# Patient Record
Sex: Male | Born: 1995 | Race: White | Hispanic: No | Marital: Single | Smoking: Never smoker
Health system: Southern US, Community
[De-identification: ages and names within clinical notes are randomized; demographics above are authoritative.]

## PROBLEM LIST (undated history)

## (undated) DIAGNOSIS — F909 Attention-deficit hyperactivity disorder, unspecified type: Secondary | ICD-10-CM

---

## 2018-06-22 ENCOUNTER — Encounter (HOSPITAL_COMMUNITY): Payer: Self-pay | Admitting: *Deleted

## 2018-06-22 ENCOUNTER — Other Ambulatory Visit: Payer: Self-pay

## 2018-06-22 ENCOUNTER — Emergency Department (HOSPITAL_COMMUNITY)
Admission: EM | Admit: 2018-06-22 | Discharge: 2018-06-22 | Discharge status: Against Medical Advice | Payer: Medicaid - Out of State | Attending: Emergency Medicine | Admitting: Emergency Medicine

## 2018-06-22 DIAGNOSIS — Z5321 Procedure and treatment not carried out due to patient leaving prior to being seen by health care provider: Secondary | ICD-10-CM | POA: Insufficient documentation

## 2018-06-22 DIAGNOSIS — R21 Rash and other nonspecific skin eruption: Secondary | ICD-10-CM | POA: Diagnosis present

## 2018-06-22 NOTE — ED Triage Notes (Signed)
Pt arrives with rash on bilateral legs. Started two days ago. Started at work, reports he had just had lunch outside prior.

## 2019-09-22 ENCOUNTER — Emergency Department (HOSPITAL_COMMUNITY)
Admission: EM | Admit: 2019-09-22 | Discharge: 2019-09-22 | Disposition: A | Discharge status: Home / Self Care | Payer: Medicaid - Out of State | Attending: Emergency Medicine | Admitting: Emergency Medicine

## 2019-09-22 ENCOUNTER — Other Ambulatory Visit: Payer: Self-pay

## 2019-09-22 ENCOUNTER — Emergency Department (HOSPITAL_COMMUNITY): Payer: Medicaid - Out of State

## 2019-09-22 ENCOUNTER — Encounter (HOSPITAL_COMMUNITY): Payer: Self-pay | Admitting: *Deleted

## 2019-09-22 DIAGNOSIS — S3022XA Contusion of scrotum and testes, initial encounter: Secondary | ICD-10-CM | POA: Insufficient documentation

## 2019-09-22 DIAGNOSIS — X58XXXA Exposure to other specified factors, initial encounter: Secondary | ICD-10-CM | POA: Insufficient documentation

## 2019-09-22 DIAGNOSIS — Y999 Unspecified external cause status: Secondary | ICD-10-CM | POA: Insufficient documentation

## 2019-09-22 DIAGNOSIS — Y939 Activity, unspecified: Secondary | ICD-10-CM | POA: Diagnosis not present

## 2019-09-22 DIAGNOSIS — Y929 Unspecified place or not applicable: Secondary | ICD-10-CM | POA: Insufficient documentation

## 2019-09-22 DIAGNOSIS — S3094XA Unspecified superficial injury of scrotum and testes, initial encounter: Secondary | ICD-10-CM | POA: Diagnosis present

## 2019-09-22 DIAGNOSIS — N50819 Testicular pain, unspecified: Secondary | ICD-10-CM

## 2019-09-22 HISTORY — DX: Attention-deficit hyperactivity disorder, unspecified type: F90.9

## 2019-09-22 LAB — URINALYSIS, ROUTINE W REFLEX MICROSCOPIC
Bilirubin Urine: NEGATIVE
Glucose, UA: NEGATIVE mg/dL
Hgb urine dipstick: NEGATIVE
Ketones, ur: NEGATIVE mg/dL
Leukocytes,Ua: NEGATIVE
Nitrite: NEGATIVE
Protein, ur: NEGATIVE mg/dL
Specific Gravity, Urine: 1.025 (ref 1.005–1.030)
pH: 5 (ref 5.0–8.0)

## 2019-09-22 NOTE — ED Provider Notes (Signed)
[facility] EMERGENCY DEPARTMENT Provider Note   CSN: [id number] Arrival date & time: 09/22/19  7001     History Chief Complaint  Patient presents with  . Testicle Pain    Andrew Hobbs is a 24 y.o. male who presents to ED with a chief complaint of right testicle pain.  States that he was wrestling about 48 hours ago when he was injured in the area.  He has had persistent aching pain since waking up the next morning.  He has tried icing it with only minimal improvement in his symptoms.  He reports pain is worse with palpation but denies any urinary symptoms, abdominal pain, vomiting, fever, penile discharge or concern for STDs.  He is concerned that he may have testicular torsion and would like to be checked for this.  HPI     Past Medical History:  Diagnosis Date  . ADHD     There are no problems to display for this patient.   History reviewed. No pertinent surgical history.     History reviewed. No pertinent family history.  Social History   Tobacco Use  . Smoking status: Never Smoker  . Smokeless tobacco: Never Used  Substance Use Topics  . Alcohol use: Not on file  . Drug use: Not on file    Home Medications Prior to Admission medications   Not on File    Allergies    Amoxicillin  Review of Systems   Review of Systems  Constitutional: Negative for appetite change, chills and fever.  HENT: Negative for ear pain, rhinorrhea, sneezing and sore throat.   Eyes: Negative for photophobia and visual disturbance.  Respiratory: Negative for cough, chest tightness, shortness of breath and wheezing.   Cardiovascular: Negative for chest pain and palpitations.  Gastrointestinal: Negative for abdominal pain, blood in stool, constipation, diarrhea, nausea and vomiting.  Genitourinary: Positive for testicular pain. Negative for discharge, dysuria, hematuria, penile swelling, scrotal swelling and urgency.  Musculoskeletal: Negative for myalgias.  Skin:  Negative for rash.  Neurological: Negative for dizziness, weakness and light-headedness.    Physical Exam Updated Vital Signs BP 110/78   Pulse (!) 54   Temp 98.1 F (36.7 C) (Oral)   Resp 16   SpO2 100%   Physical Exam Vitals and nursing note reviewed. Exam conducted with a chaperone present.  Constitutional:      General: He is not in acute distress.    Appearance: He is well-developed.  HENT:     Head: Normocephalic and atraumatic.     Nose: Nose normal.  Eyes:     General: No scleral icterus.       Left eye: No discharge.     Conjunctiva/sclera: Conjunctivae normal.  Cardiovascular:     Rate and Rhythm: Normal rate and regular rhythm.     Heart sounds: Normal heart sounds. No murmur heard.  No friction rub. No gallop.   Pulmonary:     Effort: Pulmonary effort is normal. No respiratory distress.     Breath sounds: Normal breath sounds.  Abdominal:     General: Bowel sounds are normal. There is no distension.     Palpations: Abdomen is soft.     Tenderness: There is no abdominal tenderness. There is no guarding.  Genitourinary:    Testes:        Right: Tenderness present. Mass or testicular hydrocele not present.       Comments: Tenderness in the indicated area without erythema, edema noted.  No diffuse tenderness.  RN served as Biomedical engineer for exam. Musculoskeletal:        General: Normal range of motion.     Cervical back: Normal range of motion and neck supple.  Skin:    General: Skin is warm and dry.     Findings: No rash.  Neurological:     Mental Status: He is alert.     Motor: No abnormal muscle tone.     Coordination: Coordination normal.     ED Results / Procedures / Treatments   Labs (all labs ordered are listed, but only abnormal results are displayed) Labs Reviewed  URINALYSIS, ROUTINE W REFLEX MICROSCOPIC    EKG None  Radiology US SCROTUM W/DOPPLER  Result Date: 09/22/2019 CLINICAL DATA:  Right-sided scrotal pain for 48 hours. EXAM:  SCROTAL ULTRASOUND DOPPLER ULTRASOUND OF THE TESTICLES TECHNIQUE: Complete ultrasound examination of the testicles, epididymis, and other scrotal structures was performed. Color and spectral Doppler ultrasound were also utilized to evaluate blood flow to the testicles. COMPARISON:  None. FINDINGS: Right testicle Measurements: 4.4 x 1.8 x 3.3 cm. No mass or microlithiasis visualized. Left testicle Measurements: 4.9 x 1.9 x 2.9 cm. No mass or microlithiasis visualized. Right epididymis:  Normal in size and appearance. Left epididymis:  Normal in size and appearance. Hydrocele:  Trace left hydrocele. Varicocele:  Small left varicocele. Pulsed Doppler interrogation of both testes demonstrates normal low resistance arterial and venous waveforms bilaterally. IMPRESSION: 1. No acute findings. No evidence of testicular torsion or of epididymitis/orchitis. No testicular masses. 2. Trace left hydrocele and small left varicocele. No other abnormalities. Electronically Signed   By: Amie Portland M.D.   On: 09/22/2019 13:00    Procedures Procedures (including critical care time)  Medications Ordered in ED Medications - No data to display  ED Course  I have reviewed the triage vital signs and the nursing notes.  Pertinent labs & imaging results that were available during my care of the patient were reviewed by me and considered in my medical decision making (see chart for details).    MDM Rules/Calculators/A&P                          24 year old male presenting to the ED for right testicle pain after wrestling injury 48 hours ago.  Pain worsened the next morning.  No urinary symptoms, abdominal pain, vomiting, fever, penile discharge or concern for STDs.  There is some tenderness of the right testicle noted on physical exam without erythema or edema.  No diffuse tenderness or abnormalities.  Urinalysis is unremarkable.  Scrotal ultrasound showing no acute findings, no evidence of torsion, epididymitis, orchitis  or masses. Suspect possible contusion.  Will treat with NSAIDs and PCP follow-up. Declines STD testing.   Patient is hemodynamically stable, in NAD, and able to ambulate in the ED. Evaluation does not show pathology that would require ongoing emergent intervention or inpatient treatment. I explained the diagnosis to the patient. Pain has been managed and has no complaints prior to discharge. Patient is comfortable with above plan and is stable for discharge at this time. All questions were answered prior to disposition. Strict return precautions for returning to the ED were discussed. Encouraged follow up with PCP.   An After Visit Summary was printed and given to the patient.   Portions of this note were generated with Scientist, clinical (histocompatibility and immunogenetics). Dictation errors may occur despite best attempts at proofreading.  Final Clinical Impression(s) / ED Diagnoses Final diagnoses:  Contusion  of testis, initial encounter    Rx / DC Orders ED Discharge Orders    None       Dietrich Pates, PA-C 09/22/19 1328    Arby Barrette, MD 09/25/19 [phone]

## 2019-09-22 NOTE — ED Triage Notes (Signed)
Pt reports having testicle pain x 2 days but reports wrestling and had injury 2 days ago, reports pain with palpation. No acute distress is noted at triage.

## 2019-09-22 NOTE — Discharge Instructions (Signed)
Follow instructions regarding your pain. Follow-up with your primary care provider or the 1 listed below. Return to the ER for worsening pain, additional injuries, increased swelling, redness or fever.

## 2022-01-25 IMAGING — US US SCROTUM W/ DOPPLER COMPLETE
1 series · 14 of 25 positions shown · non-contrast
Comparison: None.

CLINICAL DATA: Right-sided scrotal pain for 48 hours.

EXAM:
SCROTAL ULTRASOUND
DOPPLER ULTRASOUND OF THE TESTICLES
TECHNIQUE: Complete ultrasound examination of the testicles, epididymis, and
other scrotal structures was performed. Color and spectral Doppler
ultrasound were also utilized to evaluate blood flow to the
testicles.

[Series 1: us scrotum · 14 of 74 slices shown]
[im 1/74]
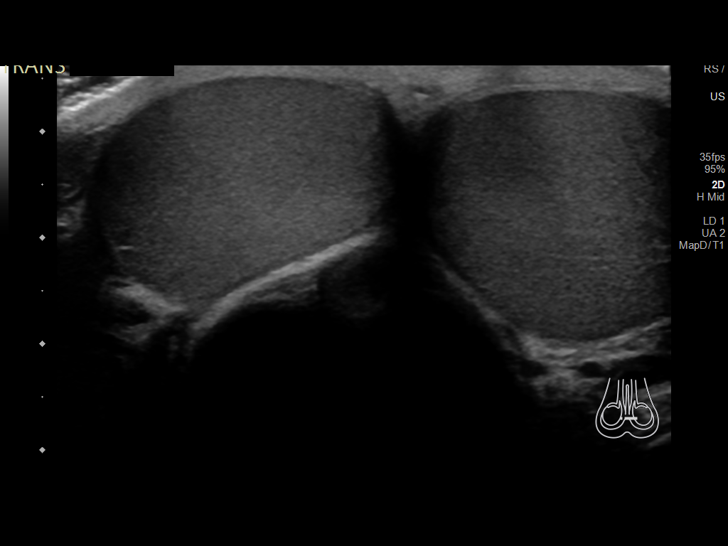
[im 7/74]
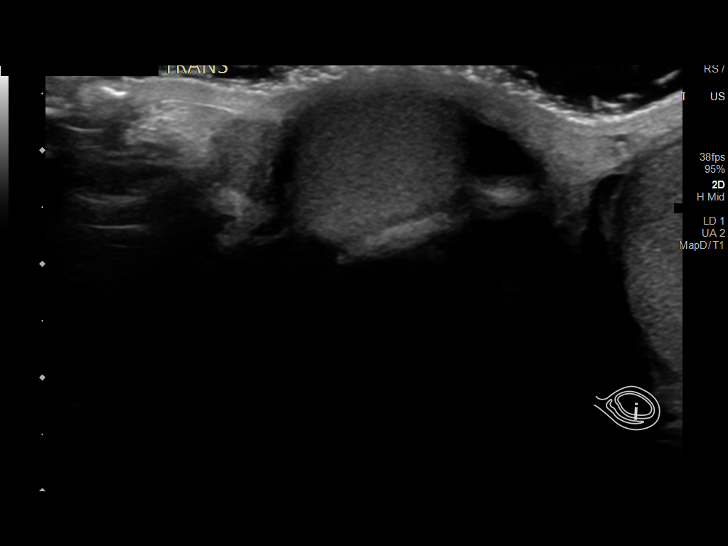
[im 13/74]
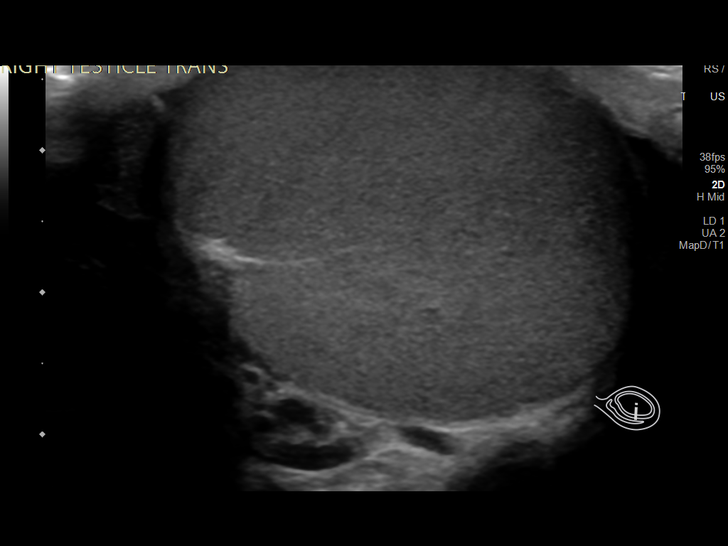
[im 19/74]
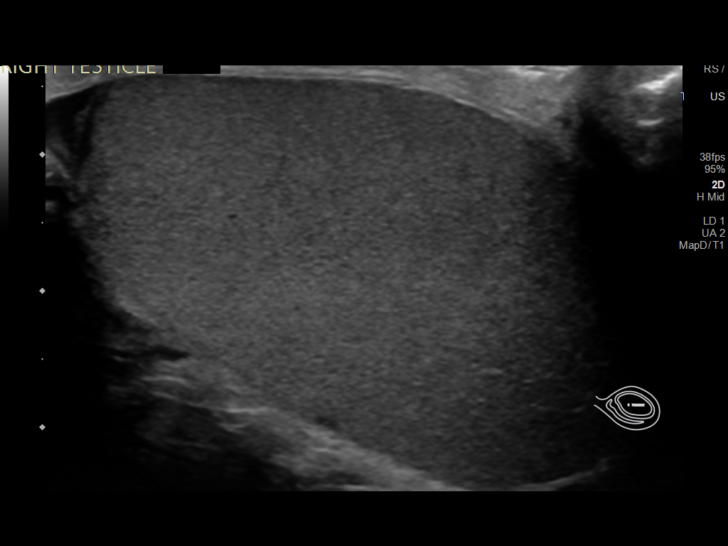
[im 25/74]
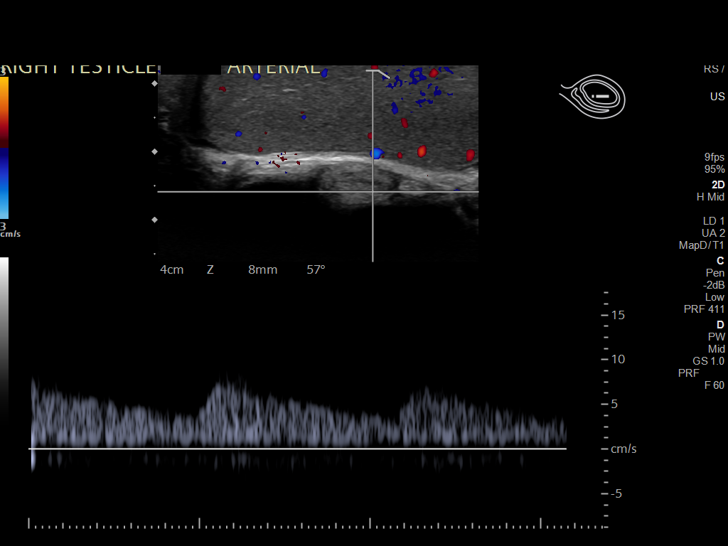
[im 28/74]
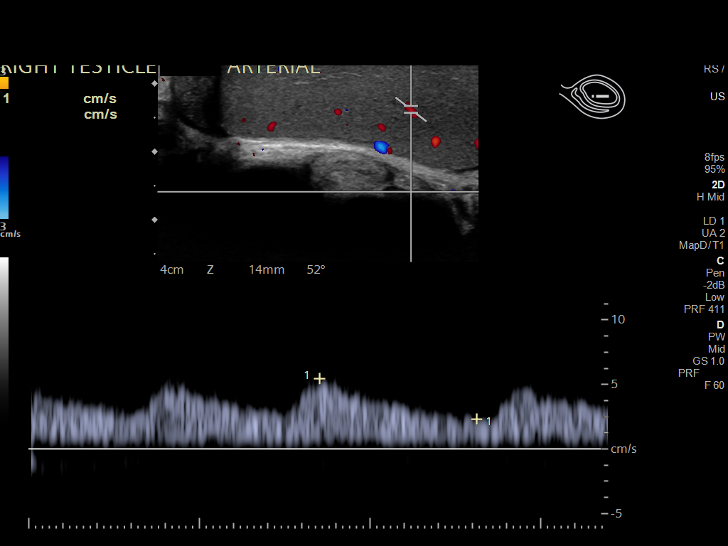
[im 34/74]
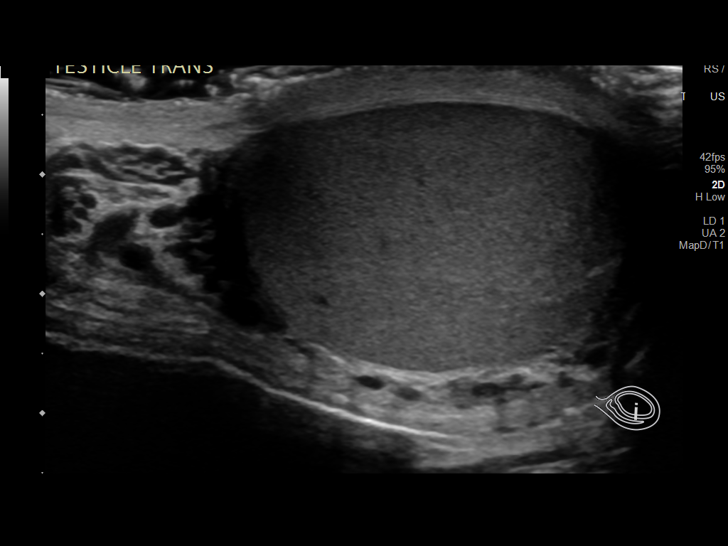
[im 40/74]
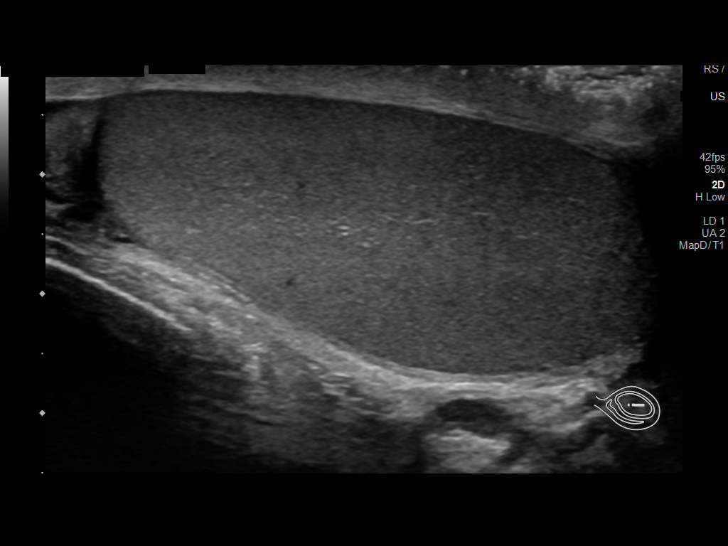
[im 46/74]
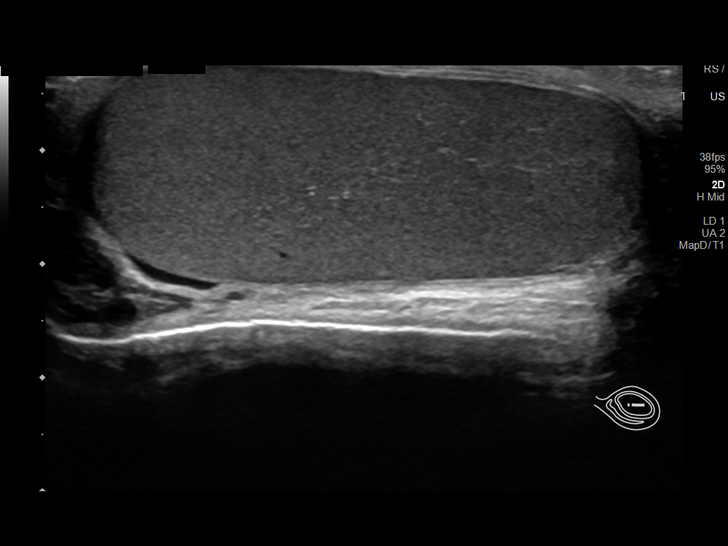
[im 49/74]
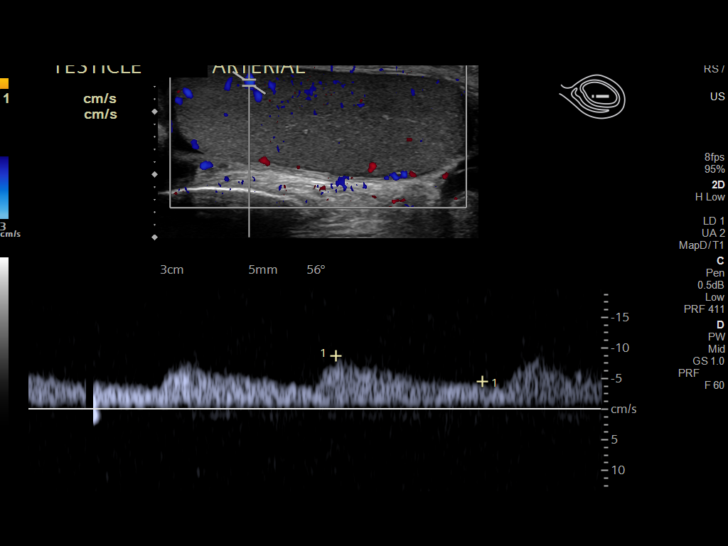
[im 55/74]
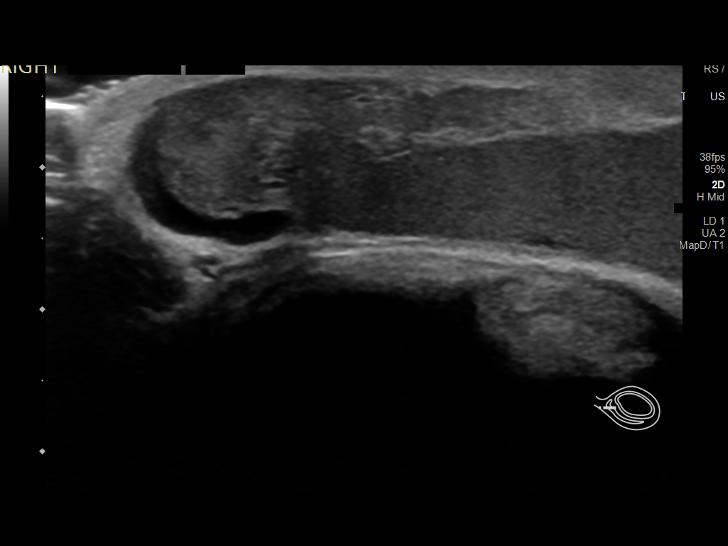
[im 61/74]
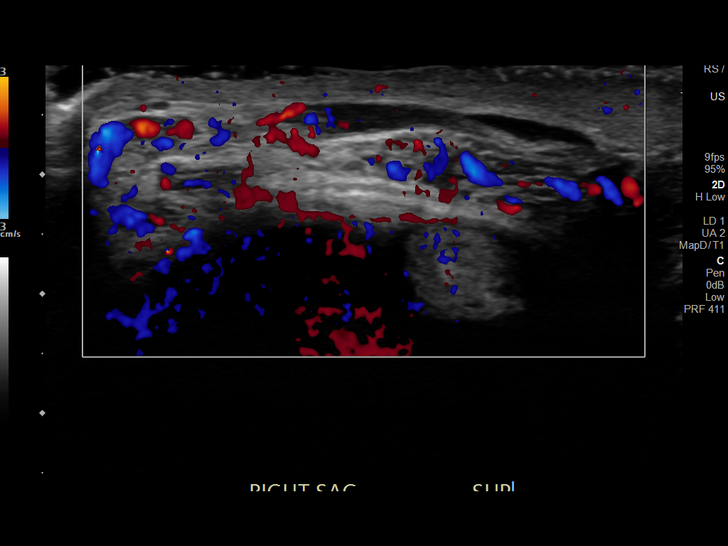
[im 67/74]
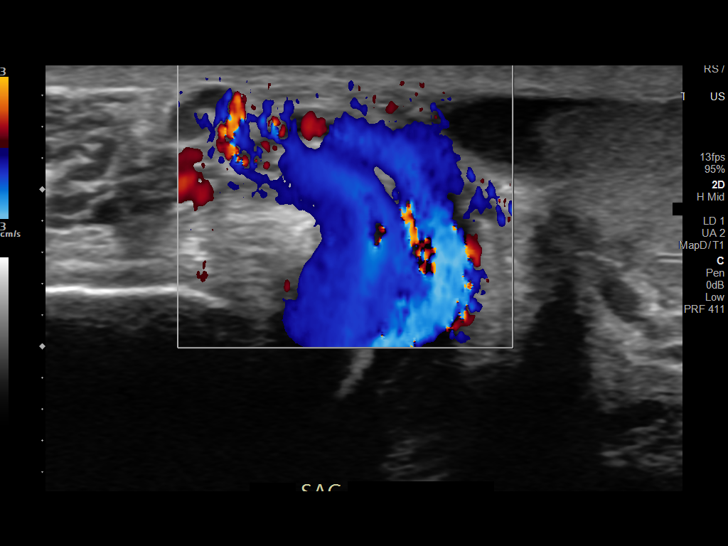
[im 74/74]
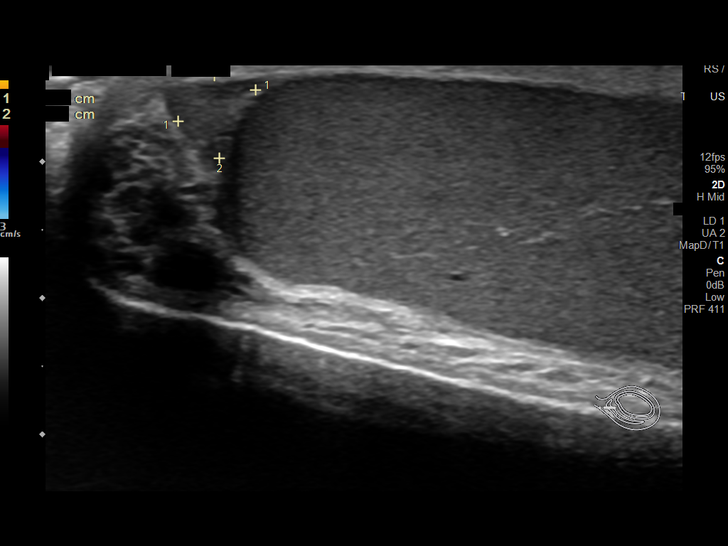

[14 of 25 positions shown; findings below may reference images not displayed]

FINDINGS: Right testicle

Measurements: 4.4 x 1.8 x 3.3 cm. No mass or microlithiasis
visualized.

Left testicle

Measurements: 4.9 x 1.9 x 2.9 cm. No mass or microlithiasis
visualized.

Right epididymis:  Normal in size and appearance.

Left epididymis:  Normal in size and appearance.

Hydrocele:  Trace left hydrocele.

Varicocele:  Small left varicocele.

Pulsed Doppler interrogation of both testes demonstrates normal low
resistance arterial and venous waveforms bilaterally.
IMPRESSION: 1. No acute findings. No evidence of testicular torsion or of
epididymitis/orchitis. No testicular masses.
2. Trace left hydrocele and small left varicocele. No other
abnormalities.
